# Patient Record
Sex: Male | Born: 2000 | Race: Black or African American | Hispanic: No | Marital: Single | Smoking: Former smoker
Health system: Southern US, Community
[De-identification: ages and names within clinical notes are randomized; demographics above are authoritative.]

## PROBLEM LIST (undated history)

## (undated) DIAGNOSIS — J45909 Unspecified asthma, uncomplicated: Secondary | ICD-10-CM

## (undated) HISTORY — PX: OTHER SURGICAL HISTORY: SHX169

---
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
## 2018-01-14 ENCOUNTER — Emergency Department: Payer: Medicaid Other

## 2018-01-14 ENCOUNTER — Encounter: Payer: Self-pay | Admitting: Emergency Medicine

## 2018-01-14 ENCOUNTER — Other Ambulatory Visit: Payer: Self-pay

## 2018-01-14 DIAGNOSIS — F17200 Nicotine dependence, unspecified, uncomplicated: Secondary | ICD-10-CM | POA: Diagnosis not present

## 2018-01-14 DIAGNOSIS — Y998 Other external cause status: Secondary | ICD-10-CM | POA: Diagnosis not present

## 2018-01-14 DIAGNOSIS — J45909 Unspecified asthma, uncomplicated: Secondary | ICD-10-CM | POA: Diagnosis not present

## 2018-01-14 DIAGNOSIS — Y9389 Activity, other specified: Secondary | ICD-10-CM | POA: Insufficient documentation

## 2018-01-14 DIAGNOSIS — Y929 Unspecified place or not applicable: Secondary | ICD-10-CM | POA: Insufficient documentation

## 2018-01-14 DIAGNOSIS — S93402A Sprain of unspecified ligament of left ankle, initial encounter: Secondary | ICD-10-CM | POA: Diagnosis not present

## 2018-01-14 DIAGNOSIS — S99912A Unspecified injury of left ankle, initial encounter: Secondary | ICD-10-CM | POA: Diagnosis present

## 2018-01-14 NOTE — ED Triage Notes (Signed)
Running , , tripped twisted left ankle , pedal pulse present, sensation and movement WNL ,

## 2018-01-15 ENCOUNTER — Emergency Department
Admission: EM | Admit: 2018-01-15 | Discharge: 2018-01-15 | Disposition: A | Discharge status: Home / Self Care | Payer: Medicaid Other | Attending: Emergency Medicine | Admitting: Emergency Medicine

## 2018-01-15 DIAGNOSIS — S93402A Sprain of unspecified ligament of left ankle, initial encounter: Secondary | ICD-10-CM
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
## 2019-02-21 IMAGING — CR DG ANKLE COMPLETE 3+V*L*
1 series · 3 of 3 positions shown · non-contrast
Comparison: None.

CLINICAL DATA: Trip and fall injury, twisting the left ankle.

EXAM:
LEFT ANKLE COMPLETE - 3+ VIEW

[Series 1: dg ankle complete left · 0.14mm/px · 3 of 3 slices shown]
[im 1/3]
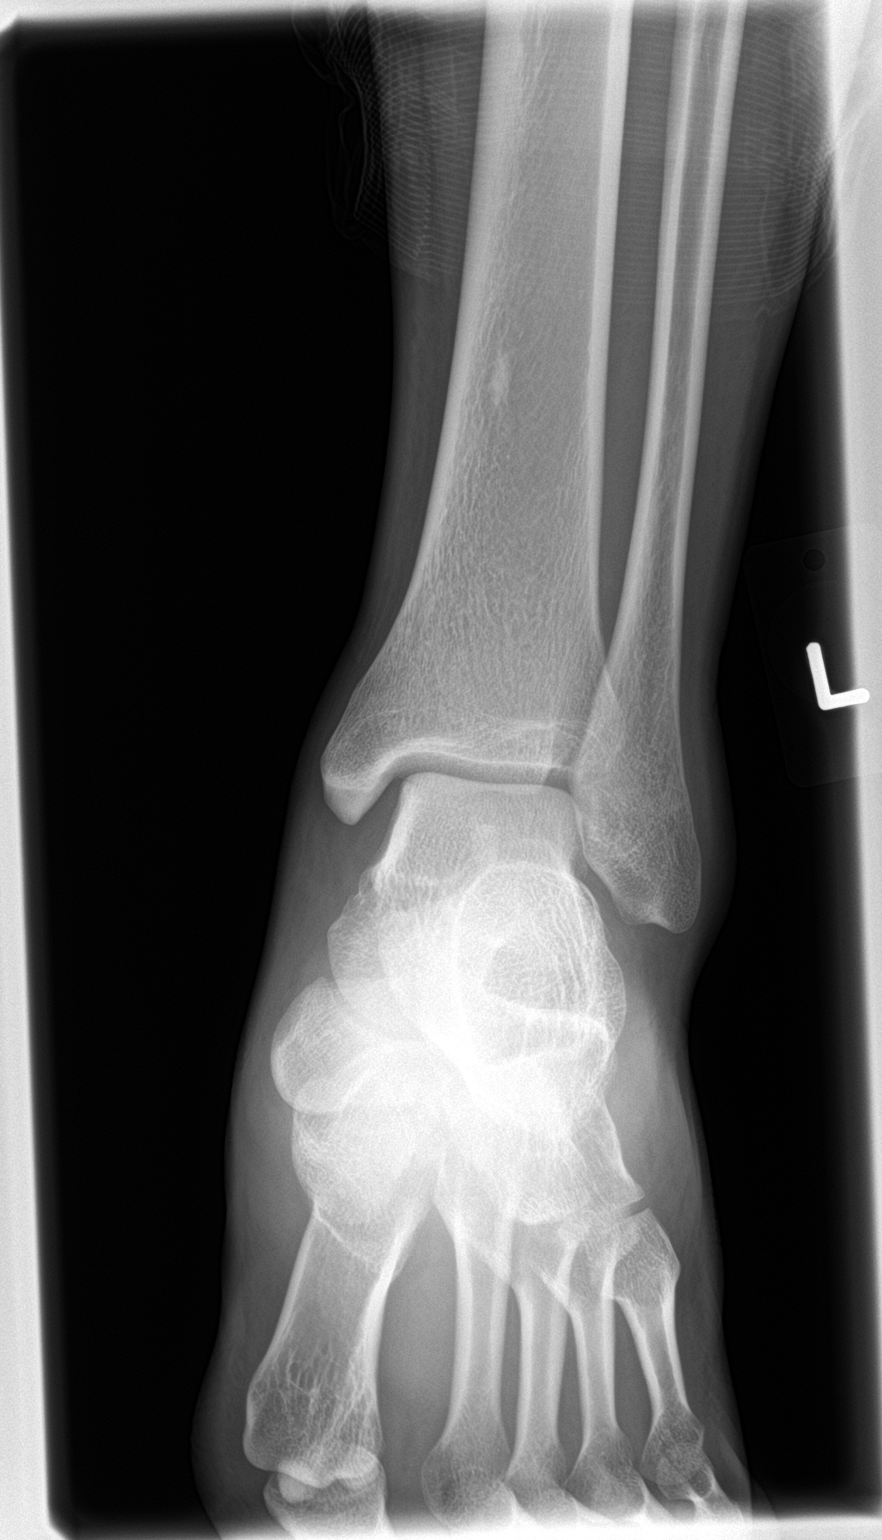
[im 2/3]
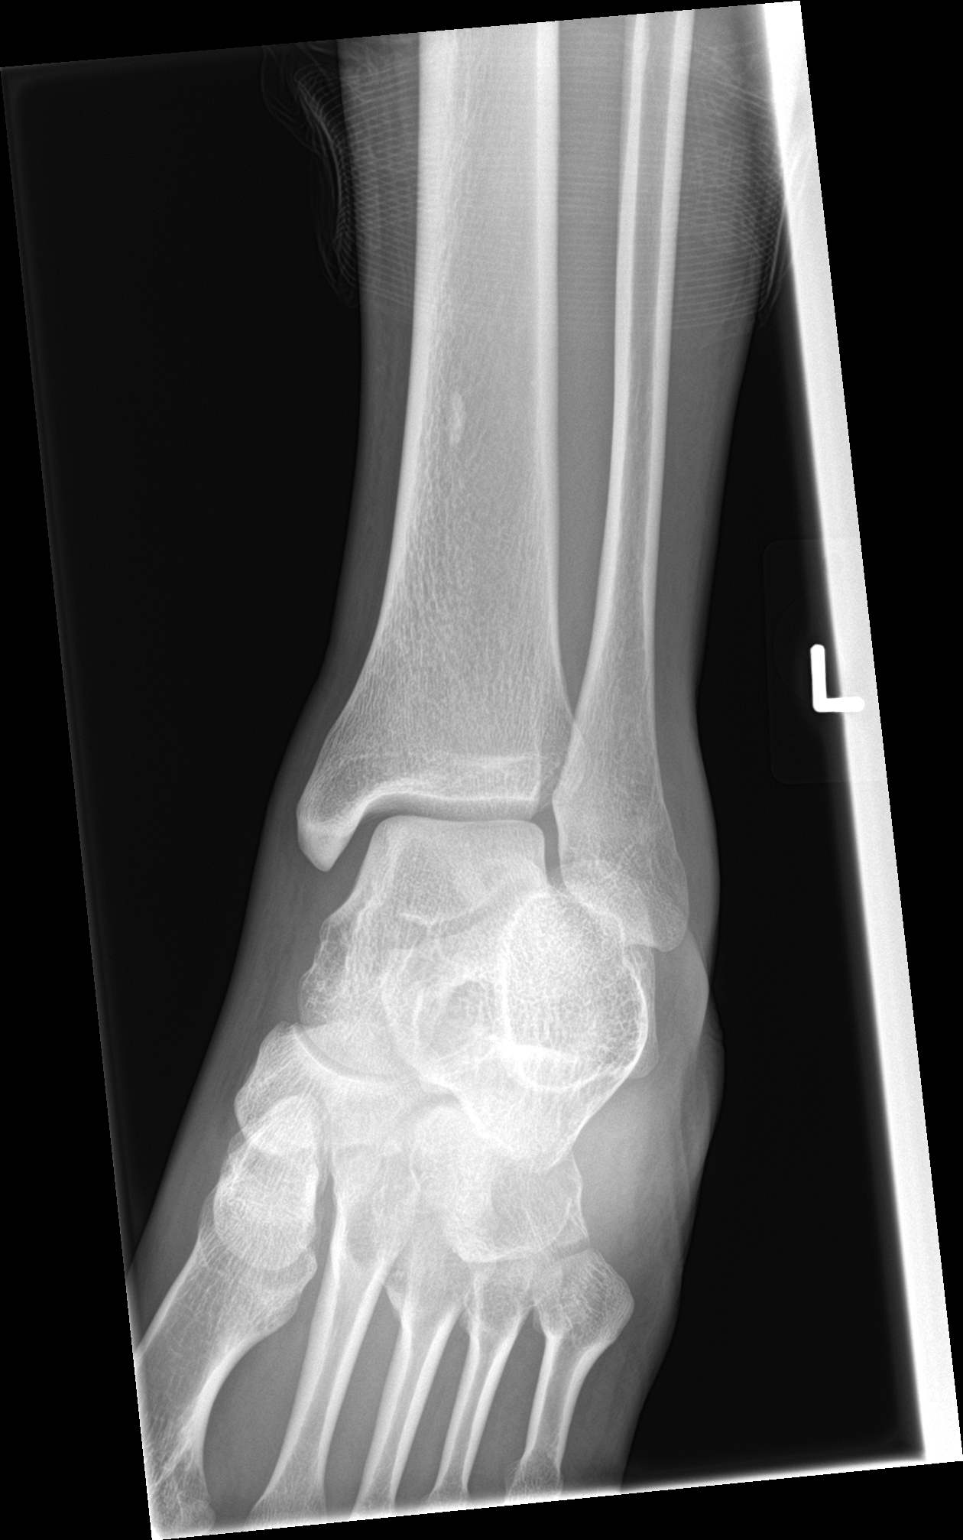
[im 3/3]
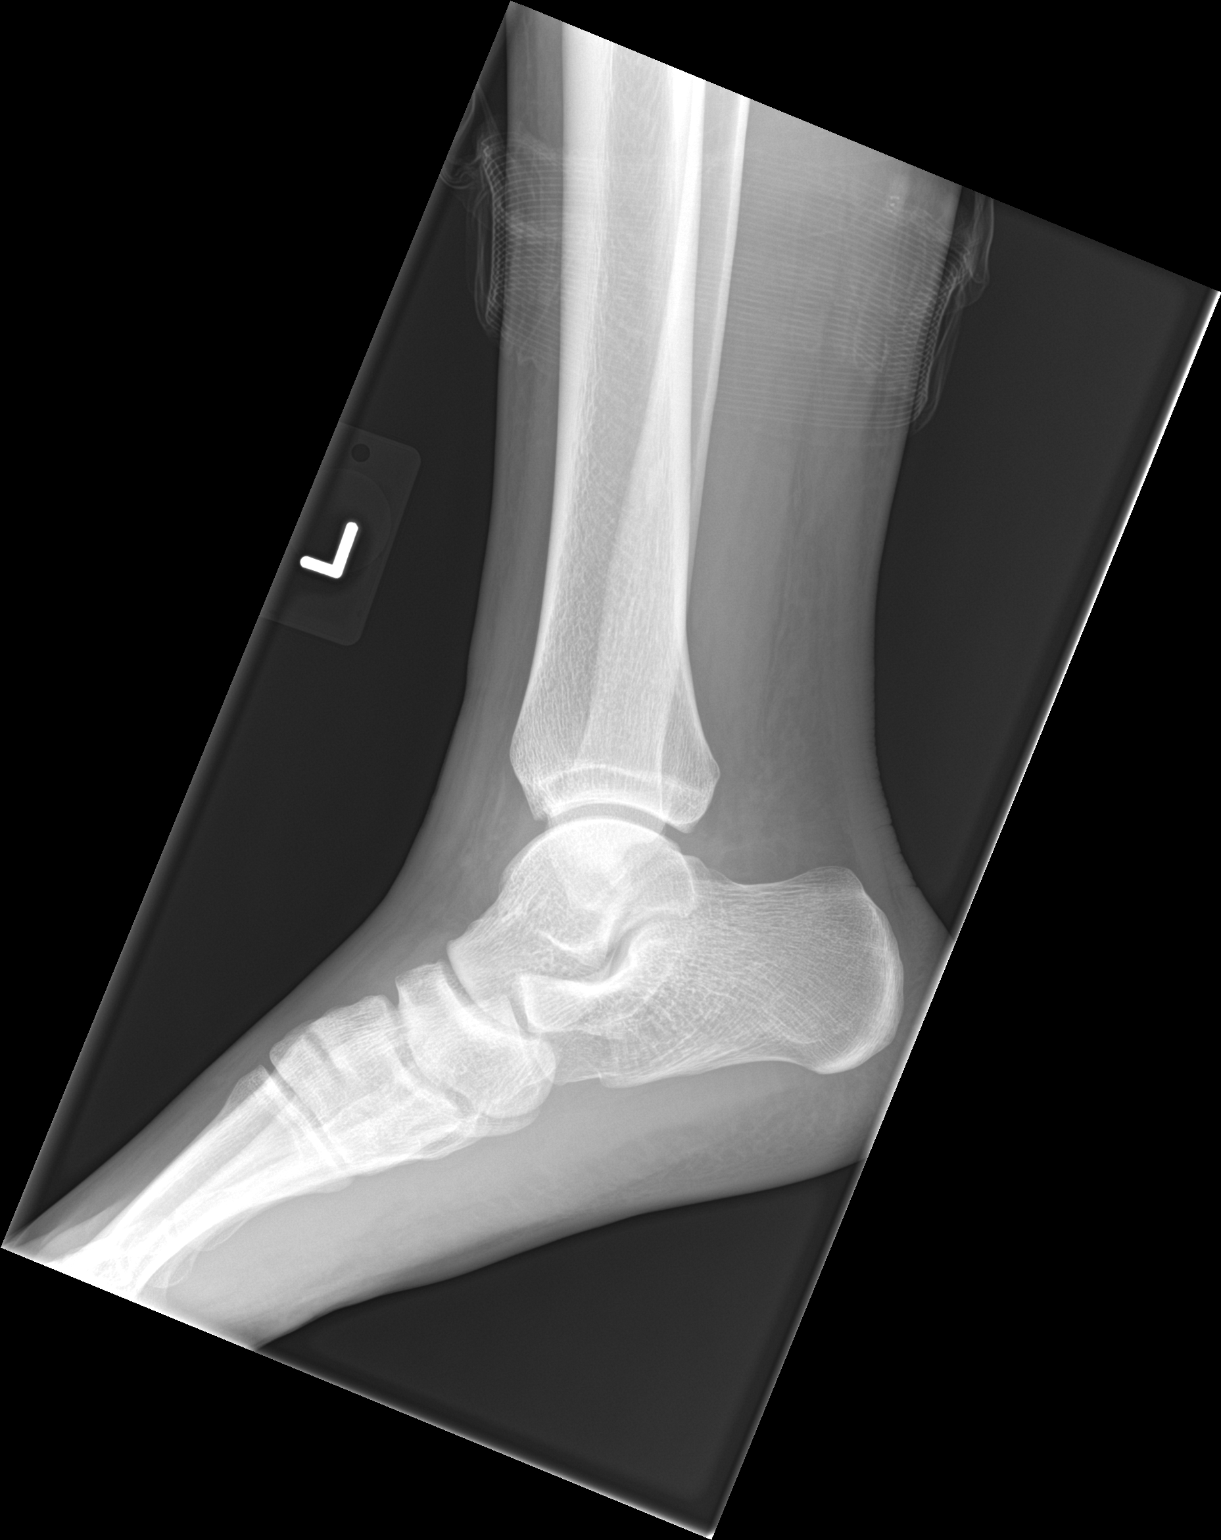

[3 of 3 positions shown; findings below may reference images not displayed]

FINDINGS: Benign-appearing sclerosis in the distal left tibial shaft. Left
ankle is otherwise intact. No evidence of acute fracture or
subluxation. No focal bone lesion or bone destruction. Bone cortex
and trabecular architecture appear intact. No radiopaque soft tissue
foreign bodies.
IMPRESSION: No acute bony abnormalities.

## 2020-01-19 ENCOUNTER — Ambulatory Visit: Payer: Self-pay | Admitting: Physician Assistant

## 2020-01-19 ENCOUNTER — Other Ambulatory Visit: Payer: Self-pay

## 2020-01-19 ENCOUNTER — Encounter: Payer: Self-pay | Admitting: Physician Assistant

## 2020-01-19 DIAGNOSIS — Z113 Encounter for screening for infections with a predominantly sexual mode of transmission: Secondary | ICD-10-CM

## 2020-01-19 LAB — GRAM STAIN

## 2020-01-19 NOTE — Progress Notes (Signed)
Gram stain reviewed, no tx per provider orders. Provider orders completed. 

## 2020-01-19 NOTE — Progress Notes (Signed)
   [facility] Department STI clinic/screening visit  Subjective:  Shawn Barnett is a 19 y.o. male being seen today for an STI screening visit. The patient reports they do not have symptoms.    Patient has the following medical conditions:  There are no problems to display for this patient.    Chief Complaint  Patient presents with  . 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  Denies chronic conditions and regular medicines.  Reports history of elbow surgery on left arm and h/o asthma.  States that he has not previously had a HIV test.    See flowsheet for further details and programmatic requirements.    The following portions of the patient's history were reviewed and updated as appropriate: allergies, current medications, past medical history, past social history, past surgical history and problem list.  Objective:  There were no vitals filed for this visit.  Physical Exam Constitutional:      General: He is not in acute distress.    Appearance: Normal appearance.  HENT:     Head: Normocephalic and atraumatic.     Comments: No nits, lice, or hair loss. No cervical, supraclavicular or axillary adenopathy.    Mouth/Throat:     Mouth: Mucous membranes are moist.     Pharynx: Oropharynx is clear. No oropharyngeal exudate or posterior oropharyngeal erythema.  Eyes:     Conjunctiva/sclera: Conjunctivae normal.  Pulmonary:     Effort: Pulmonary effort is normal.  Abdominal:     Palpations: Abdomen is soft. There is no mass.     Tenderness: There is no abdominal tenderness. There is no rebound.  Genitourinary:    Penis: Normal.      Testes: Normal.     Comments: Pubic area without nits, lice, edema, erythema, lesions and inguinal adenopathy. Penis circumcised and without rash, lesions and discharge from meatus. Musculoskeletal:     Cervical  back: Neck supple. No tenderness.  Skin:    General: Skin is warm and dry.     Findings: No bruising, erythema, lesion or rash.  Neurological:     Mental Status: He is alert and oriented to person, place, and time.  Psychiatric:        Mood and Affect: Mood normal.        Behavior: Behavior normal.        Thought Content: Thought content normal.        Judgment: Judgment normal.       Assessment and Plan:  Shawn Barnett is a 19 y.o. male presenting to the [facility] Department for STI screening  1. Screening for STD (sexually transmitted disease) Patient into clinic without symptoms. Counseled patient re:  HSV- dz, transmission, cyclic nature, subclinical disease and treatment options. Counseled that he should RTC if he has lesion/s for evaluation or see PCP or urgent care for HSV serology.  Rec condoms with all sex. Await test results.  Counseled that RN will call if needs to RTC for treatment once results are back. - Gram stain - Gonococcus culture - HIV/HCV [place] - Syphilis Serology, [facility]     No follow-ups on file.  No future appointments.  Matt Holmes, PA

## 2020-01-24 LAB — GONOCOCCUS CULTURE

## 2020-01-30 ENCOUNTER — Encounter: Payer: Self-pay | Admitting: Family Medicine

## 2020-01-30 LAB — HM HIV SCREENING LAB: HM HIV Screening: NEGATIVE

## 2020-01-30 LAB — HM HEPATITIS C SCREENING LAB: HM Hepatitis Screen: NEGATIVE

## 2022-02-06 ENCOUNTER — Encounter: Payer: Self-pay | Admitting: Nurse Practitioner

## 2022-02-06 ENCOUNTER — Ambulatory Visit: Payer: Self-pay | Admitting: Nurse Practitioner

## 2022-02-06 DIAGNOSIS — Z113 Encounter for screening for infections with a predominantly sexual mode of transmission: Secondary | ICD-10-CM

## 2022-02-06 DIAGNOSIS — J45909 Unspecified asthma, uncomplicated: Secondary | ICD-10-CM | POA: Insufficient documentation

## 2022-02-06 LAB — HM HEPATITIS C SCREENING LAB: HM Hepatitis Screen: NEGATIVE

## 2022-02-06 LAB — HM HIV SCREENING LAB: HM HIV Screening: NEGATIVE

## 2022-02-06 LAB — HEPATITIS B SURFACE ANTIGEN: Hepatitis B Surface Ag: NONREACTIVE

## 2022-02-06 NOTE — Progress Notes (Signed)
[facility] Department STI clinic/screening visit  Subjective:  Shawn Barnett is a 21 y.o. male being seen today for an STI screening visit. The patient reports they do not have symptoms.    Patient has the following medical conditions:   Patient Active Problem List   Diagnosis Date Noted   Asthma 02/06/2022     Chief Complaint  Patient presents with   SEXUALLY TRANSMITTED DISEASE    Screening    HPI  Patient reports to clinic today for STD screening.  Patient is currently asymptomatic.    Does the patient or their partner desires a pregnancy in the next year? No  Screening for MPX risk: Does the patient have an unexplained rash? No Is the patient MSM? No Does the patient endorse multiple sex partners or anonymous sex partners? No Did the patient have close or sexual contact with a person diagnosed with MPX? No Has the patient traveled outside the [place] where MPX is endemic? No Is there a high clinical suspicion for MPX-- evidenced by one of the following No  -Unlikely to be chickenpox  -Lymphadenopathy  -Rash that present in same phase of evolution on any given body part   See flowsheet for further details and programmatic requirements.    There is no immunization history on file for this patient.   The following portions of the patient's history were reviewed and updated as appropriate: allergies, current medications, past medical history, past social history, past surgical history and problem list.  Objective:  There were no vitals filed for this visit.  Physical Exam Constitutional:      Appearance: Normal appearance.  HENT:     Head: Normocephalic. No abrasion, masses or laceration. Hair is normal.     Right Ear: External ear normal.     Left Ear: External ear normal.     Nose: Nose normal.     Mouth/Throat:     Mouth: No oral lesions.     Dentition: No dental caries.     Pharynx: No pharyngeal swelling, oropharyngeal exudate, posterior  oropharyngeal erythema or uvula swelling.     Tonsils: No tonsillar exudate or tonsillar abscesses.  Eyes:     General: Lids are normal.        Right eye: No discharge.        Left eye: No discharge.     Conjunctiva/sclera: Conjunctivae normal.     Right eye: No exudate.    Left eye: No exudate. Abdominal:     General: Abdomen is flat.     Palpations: Abdomen is soft.     Tenderness: There is no abdominal tenderness. There is no rebound.  Genitourinary:    Pubic Area: No rash or pubic lice.      Penis: Normal and circumcised. No erythema or discharge.      Testes: Normal.        Right: Mass or tenderness not present.        Left: Mass or tenderness not present.     Rectum: Normal.     Comments: Discharge amount: None Color: None  Musculoskeletal:     Cervical back: Full passive range of motion without pain, normal range of motion and neck supple.  Lymphadenopathy:     Cervical: No cervical adenopathy.     Right cervical: No superficial, deep or posterior cervical adenopathy.    Left cervical: No superficial, deep or posterior cervical adenopathy.     Upper Body:     Right upper body:  No supraclavicular, axillary or epitrochlear adenopathy.     Left upper body: No supraclavicular, axillary or epitrochlear adenopathy.     Lower Body: No right inguinal adenopathy. No left inguinal adenopathy.  Skin:    General: Skin is warm and dry.     Findings: No lesion or rash.  Neurological:     Mental Status: He is alert and oriented to person, place, and time.  Psychiatric:        Attention and Perception: Attention normal.        Mood and Affect: Mood normal.        Speech: Speech normal.        Behavior: Behavior normal. Behavior is cooperative.       Assessment and Plan:  Shawn Barnett is a 21 y.o. male presenting to the [facility] Department for STI screening  1. Screening examination for venereal disease -21 year old male in clinic today for STD  screening. -Patient does not have STI symptoms Patient accepted all screenings including oral GC, urine CT/GC and bloodwork for HIV/RPR.  Patient meets criteria for HepB screening? Yes. Ordered? Yes Patient meets criteria for HepC screening? Yes. Ordered? Yes Recommended condom use with all sex Discussed importance of condom use for STI prevent   Discussed time line for [facility] results and that patient will be called with positive results and encouraged patient to call if he had not heard in 2 weeks Recommended returning for continued or worsening symptoms  - HIV/HCV [place] [facility] - Syphilis Serology, [place] [facility] - HBV Antigen/Antibody [facility] - Chlamydia/GC NAA, Confirmation - Gonococcus culture   Total time spent: 20 minutes   Return if symptoms worsen or fail to improve.    Glenna Fellows, FNP

## 2022-02-06 NOTE — Progress Notes (Signed)
Pt here for STD screening.  Condoms given.  Charlton Boule M Syon Tews, RN  

## 2022-02-10 LAB — GONOCOCCUS CULTURE

## 2022-02-11 LAB — CHLAMYDIA/GC NAA, CONFIRMATION
Chlamydia trachomatis, NAA: NEGATIVE
Neisseria gonorrhoeae, NAA: NEGATIVE

## 2023-02-09 ENCOUNTER — Encounter: Payer: Self-pay | Admitting: Emergency Medicine

## 2023-02-09 ENCOUNTER — Emergency Department
Admission: EM | Admit: 2023-02-09 | Discharge: 2023-02-09 | Disposition: A | Discharge status: Home / Self Care | Payer: Self-pay | Attending: Emergency Medicine | Admitting: Emergency Medicine

## 2023-02-09 ENCOUNTER — Other Ambulatory Visit: Payer: Self-pay

## 2023-02-09 DIAGNOSIS — R55 Syncope and collapse: Secondary | ICD-10-CM | POA: Insufficient documentation

## 2023-02-09 DIAGNOSIS — E860 Dehydration: Secondary | ICD-10-CM | POA: Insufficient documentation

## 2023-02-09 DIAGNOSIS — K029 Dental caries, unspecified: Secondary | ICD-10-CM | POA: Insufficient documentation

## 2023-02-09 DIAGNOSIS — K047 Periapical abscess without sinus: Secondary | ICD-10-CM | POA: Insufficient documentation

## 2023-02-09 LAB — URINALYSIS, ROUTINE W REFLEX MICROSCOPIC
Bacteria, UA: NONE SEEN
Bilirubin Urine: NEGATIVE
Glucose, UA: NEGATIVE mg/dL
Hgb urine dipstick: NEGATIVE
Ketones, ur: 5 mg/dL — AB
Leukocytes,Ua: NEGATIVE
Nitrite: NEGATIVE
Protein, ur: 30 mg/dL — AB
Specific Gravity, Urine: 1.027 (ref 1.005–1.030)
pH: 6 (ref 5.0–8.0)

## 2023-02-09 LAB — CBC WITH DIFFERENTIAL/PLATELET
Abs Immature Granulocytes: 0.04 10*3/uL (ref 0.00–0.07)
Basophils Absolute: 0 10*3/uL (ref 0.0–0.1)
Basophils Relative: 0 %
Eosinophils Absolute: 0.1 10*3/uL (ref 0.0–0.5)
Eosinophils Relative: 1 %
HCT: 46.4 % (ref 39.0–52.0)
Hemoglobin: 16.1 g/dL (ref 13.0–17.0)
Immature Granulocytes: 0 %
Lymphocytes Relative: 24 %
Lymphs Abs: 2.5 10*3/uL (ref 0.7–4.0)
MCH: 29.9 pg (ref 26.0–34.0)
MCHC: 34.7 g/dL (ref 30.0–36.0)
MCV: 86.1 fL (ref 80.0–100.0)
Monocytes Absolute: 0.7 10*3/uL (ref 0.1–1.0)
Monocytes Relative: 7 %
Neutro Abs: 7 10*3/uL (ref 1.7–7.7)
Neutrophils Relative %: 68 %
Platelets: 294 10*3/uL (ref 150–400)
RBC: 5.39 MIL/uL (ref 4.22–5.81)
RDW: 13.9 % (ref 11.5–15.5)
WBC: 10.4 10*3/uL (ref 4.0–10.5)
nRBC: 0 % (ref 0.0–0.2)

## 2023-02-09 LAB — COMPREHENSIVE METABOLIC PANEL
ALT: 32 U/L (ref 0–44)
AST: 72 U/L — ABNORMAL HIGH (ref 15–41)
Albumin: 4.1 g/dL (ref 3.5–5.0)
Alkaline Phosphatase: 37 U/L — ABNORMAL LOW (ref 38–126)
Anion gap: 7 (ref 5–15)
BUN: 13 mg/dL (ref 6–20)
CO2: 27 mmol/L (ref 22–32)
Calcium: 8.7 mg/dL — ABNORMAL LOW (ref 8.9–10.3)
Chloride: 104 mmol/L (ref 98–111)
Creatinine, Ser: 1.14 mg/dL (ref 0.61–1.24)
GFR, Estimated: >60 mL/min (ref >60)
Glucose, Bld: 87 mg/dL (ref 70–99)
Potassium: 4.3 mmol/L (ref 3.5–5.1)
Sodium: 138 mmol/L (ref 135–145)
Total Bilirubin: 0.7 mg/dL (ref 0.3–1.2)
Total Protein: 6.4 g/dL — ABNORMAL LOW (ref 6.5–8.1)

## 2023-02-09 LAB — TROPONIN I (HIGH SENSITIVITY): Troponin I (High Sensitivity): 8 ng/L (ref <18)

## 2023-02-09 NOTE — Discharge Instructions (Signed)
Please make sure you are drinking lots of fluids.  Drink electrolyte sports drinks to help replenish electrolytes.  Return to the ER for any dizziness lightheadedness chest pain shortness of breath for any worsening symptoms or any urgent changes in your health

## 2023-02-09 NOTE — ED Triage Notes (Signed)
EMS brings pt in from work for c/o "feeling light-headed"; no med hx, no meds

## 2023-02-09 NOTE — ED Triage Notes (Addendum)
Pt presents ambulatory to triage via [facility] with complaints of dehydration. The pt states he was working and lifting a heavy item and became diaphoretic and felt like he was going to pass out. No syncopal episode nor fall. He notes not eating a lot today, he has consumed a cupcake and bag of chips with water all day and he donated plasma today. A&Ox4 at this time. Denies CP or SOB.

## 2023-02-09 NOTE — ED Provider Notes (Signed)
EMERGENCY DEPARTMENT AT [facility] Provider Note   CSN: [id number] Arrival date & time: 02/09/23  1902     History  Chief Complaint  Patient presents with   Dehydration    Shawn Barnett is a 22 y.o. male presents to the emergency department for evaluation of dehydration.  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  He had been working outside admits to not having a lot to eat or drink today.  Patient experienced feeling dizzy and lightheaded with a near syncopal episode.  No falls trauma or injury.  Patient states he only had a cupcake and a bag of chips.  He had also donated plasma today.  Denies any chest pain or shortness of breath although he says when he got dizzy at work he felt a little bit of chest pain.  Has not felt any chest pain since.  He denies any current dizziness, lightheadedness, nausea or vomiting.  He has been drinking fluids since he arrived in the ER.  No history of dizziness, palpitations.  HPI     Home Medications Prior to Admission medications   Not on File      Allergies    Patient has no known allergies.    Review of Systems   Review of Systems  Physical Exam Updated Vital Signs BP 118/63 (BP Location: Left Arm)   Pulse 74   Temp 98.1 F (36.7 C) (Oral)   Resp 18   Ht 5\' 5"  (1.651 m)   Wt 62.6 kg   SpO2 100%   BMI 22.96 kg/m  Physical Exam Constitutional:      General: He is not in acute distress.    Appearance: He is well-developed.  HENT:     Head: Normocephalic and atraumatic.     Jaw: No trismus.     Right Ear: External ear normal.     Left Ear: External ear normal.     Nose: Nose normal.     Mouth/Throat:     Mouth: Oropharynx is clear and moist. No oral lesions.     Dentition: Dental caries and dental abscesses present.     Pharynx: Uvula midline. No oropharyngeal exudate, posterior oropharyngeal erythema or uvula swelling.  Eyes:     Extraocular Movements: EOM normal.     Conjunctiva/sclera:  Conjunctivae normal.  Cardiovascular:     Rate and Rhythm: Normal rate and regular rhythm.     Heart sounds: No murmur heard.    No friction rub. No gallop.  Pulmonary:     Effort: Pulmonary effort is normal. No respiratory distress.     Breath sounds: Normal breath sounds.  Abdominal:     General: Abdomen is flat. Bowel sounds are normal.     Palpations: Abdomen is soft.  Musculoskeletal:        General: Normal range of motion.     Cervical back: Normal range of motion and neck supple.  Skin:    General: Skin is warm and dry.     Findings: No rash.  Neurological:     General: No focal deficit present.     Mental Status: He is alert and oriented to person, place, and time. Mental status is at baseline.     Cranial Nerves: No cranial nerve deficit.  Psychiatric:        Mood and Affect: Mood and affect and mood normal.        Behavior: Behavior normal.        Thought Content: Thought  content normal.     ED Results / Procedures / Treatments   Labs (all labs ordered are listed, but only abnormal results are displayed) Labs Reviewed  COMPREHENSIVE METABOLIC PANEL - Abnormal; Notable for the following components:      Result Value   Calcium 8.7 (*)    Total Protein 6.4 (*)    AST 72 (*)    Alkaline Phosphatase 37 (*)    All other components within normal limits  URINALYSIS, ROUTINE W REFLEX MICROSCOPIC - Abnormal; Notable for the following components:   Color, Urine YELLOW (*)    APPearance HAZY (*)    Ketones, ur 5 (*)    Protein, ur 30 (*)    All other components within normal limits  CBC WITH DIFFERENTIAL/PLATELET  TROPONIN I (HIGH SENSITIVITY)    EKG None  Radiology No results found.  Procedures Procedures    Medications Ordered in ED Medications - No data to display  ED Course/ Medical Decision Making/ A&P                             Medical Decision Making Amount and/or Complexity of Data Reviewed Labs: ordered.   22 year old male with feelings  of dizziness and lightheadedness at work while working outside.  Patient reports feelings of diaphoresis with an episode of chest pain lasting only a couple of minutes that was sharp.  Vital signs are stable, afebrile nontachycardic.  CBC and BMP within normal limits.  Troponin normal along with normal EKGs showing normal sinus rhythm with no abnormalities.  Patient's urinalysis positive for ketones consistent with dehydration.  No sign of any infectious process.  Patient tolerating p.o. fluids here in the ED.  Able to stand and ambulate with no feelings of weakness dizziness or lightheadedness.  He feels well.  He is encouraged to increase p.o. fluids and electrolytes.  He understands signs symptoms return to the ER for. Final Clinical Impression(s) / ED Diagnoses Final diagnoses:  Dehydration    Rx / DC Orders ED Discharge Orders     None         Ronnette Juniper 02/09/23 2101    Jene Every, MD 02/10/23 [phone]

## 2024-02-29 ENCOUNTER — Ambulatory Visit: Payer: Self-pay
